# Patient Record
Sex: Female | Born: 1960 | Hispanic: Yes | Marital: Single | Smoking: Never smoker
Health system: Southern US, Community
[De-identification: ages and names within clinical notes are randomized; demographics above are authoritative.]

---

## 2009-04-22 ENCOUNTER — Ambulatory Visit: Payer: Self-pay | Admitting: Family Medicine

## 2009-05-01 ENCOUNTER — Ambulatory Visit: Payer: Self-pay | Admitting: Internal Medicine

## 2010-03-19 ENCOUNTER — Emergency Department: Payer: Self-pay | Admitting: Unknown Physician Specialty

## 2010-05-07 ENCOUNTER — Emergency Department: Payer: Self-pay | Admitting: Emergency Medicine

## 2011-11-21 IMAGING — CR DG CHEST 2V
1 series · 2 of 2 positions shown · non-contrast
Comparison: none

REASON FOR EXAM: cough
COMMENTS:

PROCEDURE:     DXR - DXR CHEST PA (OR AP) AND LATERAL  - May 07, 2010  [DATE]
RESULT:     The lungs are clear. The cardiac silhouette and visualized bony
skeleton are unremarkable.

[Series 1: view not recorded · 0.17mm/px · 2 of 2 slices shown]
[im 1/2]
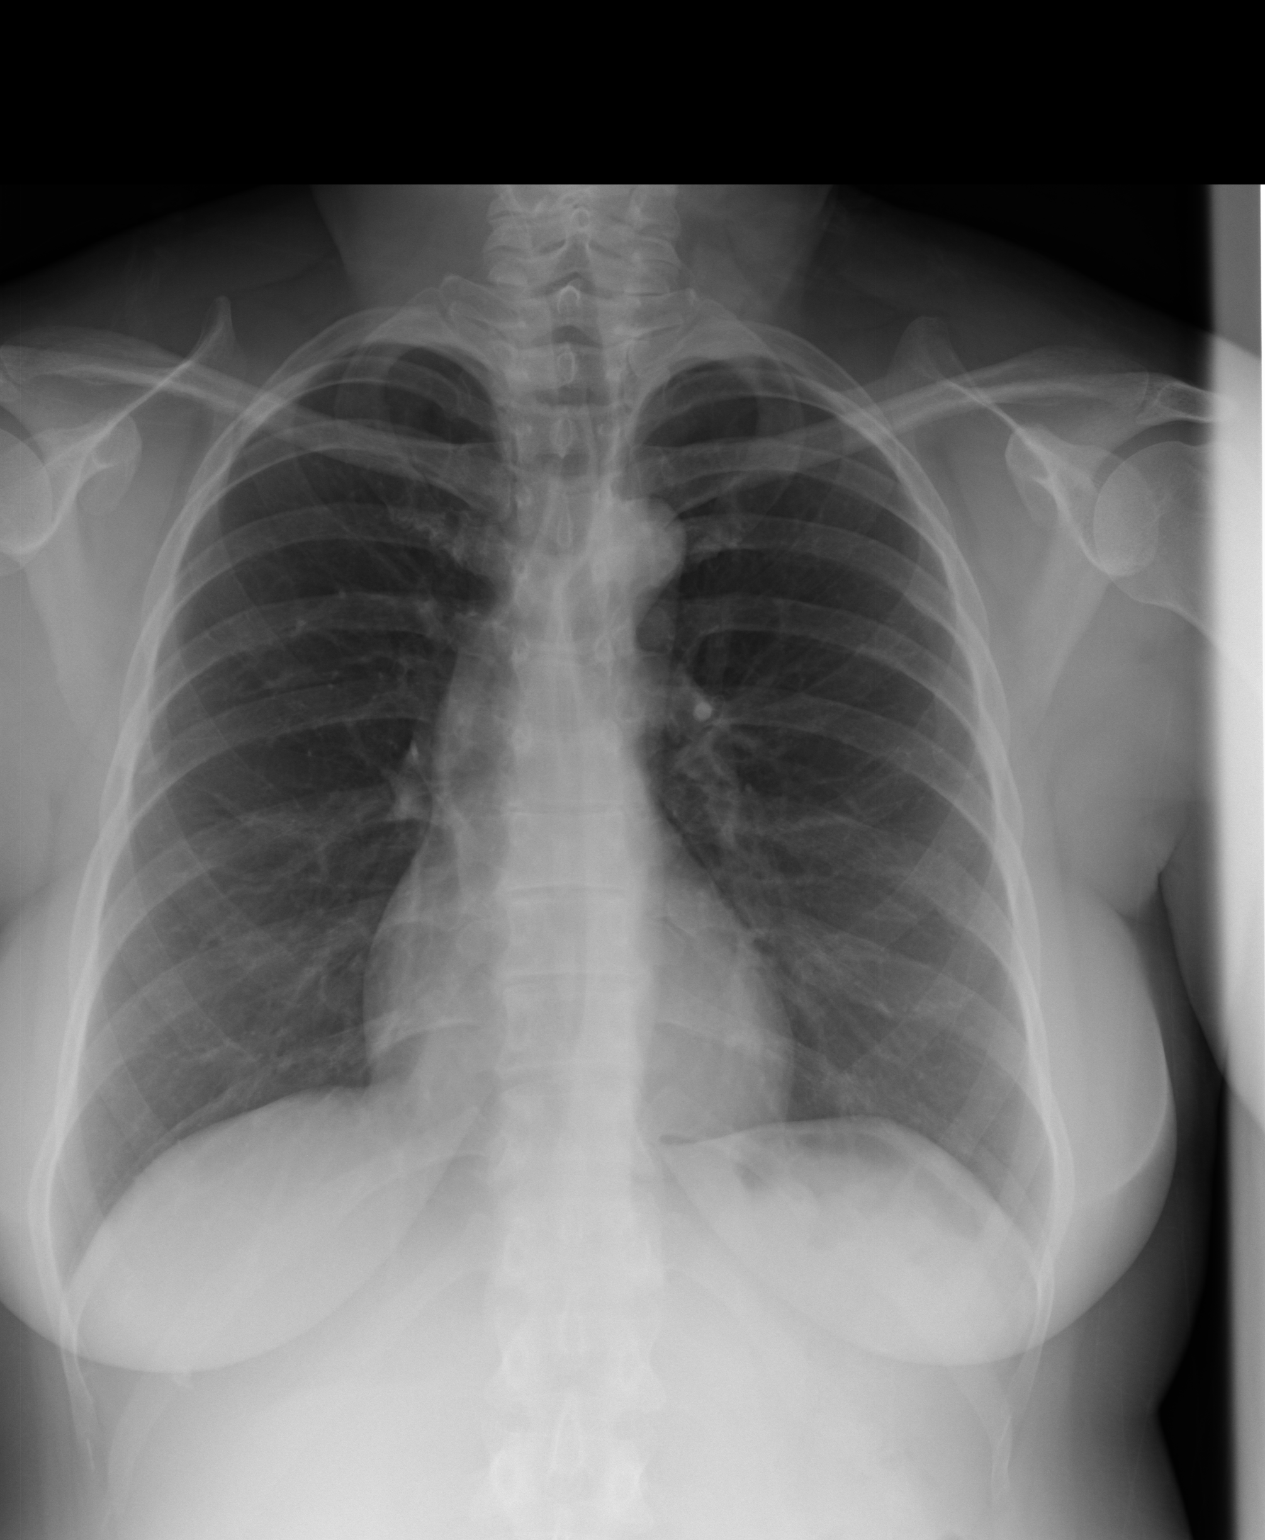
[im 2/2]
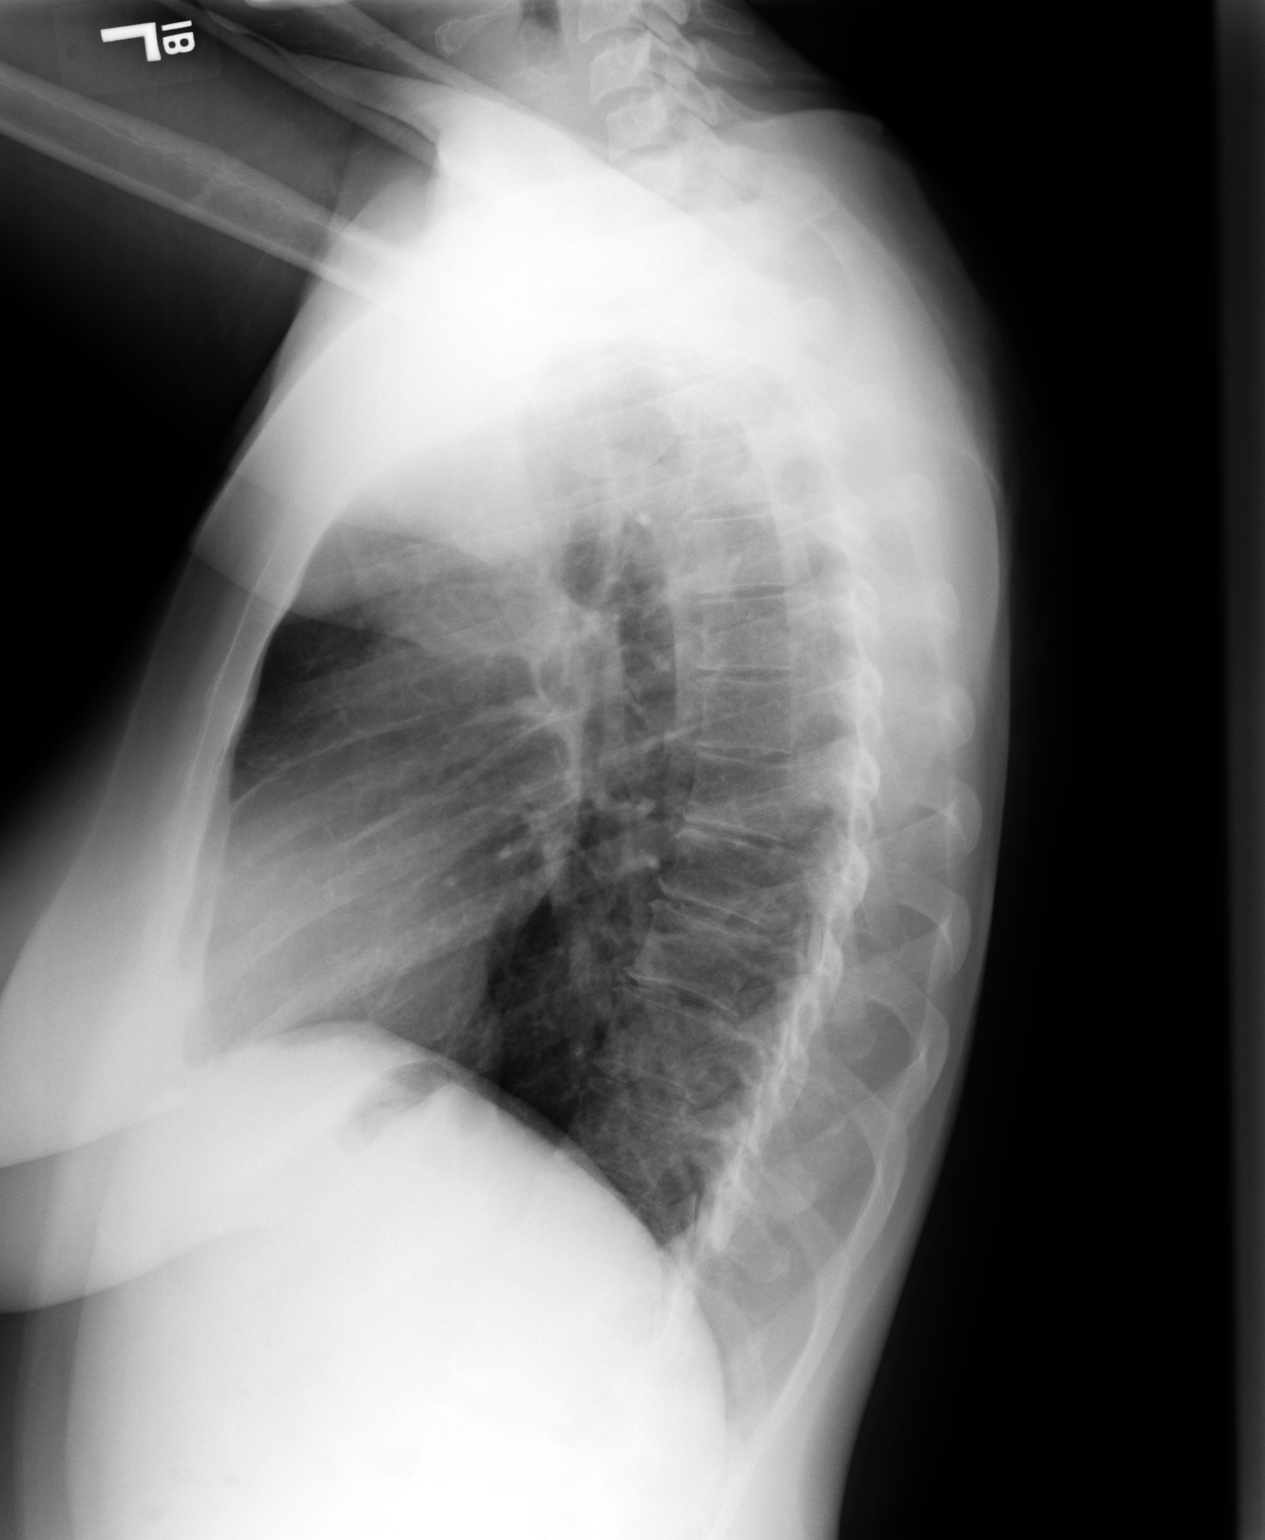

[2 of 2 positions shown; findings below may reference images not displayed]

IMPRESSION: 1. Chest radiograph without evidence of acute cardiopulmonary disease.

## 2015-04-08 ENCOUNTER — Ambulatory Visit: Payer: Self-pay

## 2015-04-17 ENCOUNTER — Ambulatory Visit: Payer: Self-pay | Attending: Oncology | Admitting: *Deleted

## 2015-04-17 ENCOUNTER — Encounter: Payer: Self-pay | Admitting: *Deleted

## 2015-04-17 ENCOUNTER — Ambulatory Visit
Admission: RE | Admit: 2015-04-17 | Discharge: 2015-04-17 | Disposition: A | Discharge status: Home / Self Care | Payer: Self-pay | Source: Ambulatory Visit | Attending: Oncology | Admitting: Oncology

## 2015-04-17 VITALS — BP 117/80 | HR 75 | Temp 97.3°F | Ht 62.21 in | Wt 154.8 lb

## 2015-04-17 DIAGNOSIS — Z0000 Encounter for general adult medical examination without abnormal findings: Secondary | ICD-10-CM

## 2015-04-17 NOTE — Progress Notes (Signed)
Subjective:     Patient ID: Tiffany Figueroa, female   DOB: [birth year], 54 y.o.   MRN: [id number]  HPI   Review of Systems     Objective:   Physical Exam  Pulmonary/Chest: Right breast exhibits no inverted nipple, no mass, no nipple discharge, no skin change and no tenderness. Left breast exhibits no inverted nipple, no mass, no nipple discharge, no skin change and no tenderness. Breasts are symmetrical.       Assessment:     54 year old Hispanic female referred to [facility] by Jeralyn Ruths for clinical breast exam and mammogram.  Debarah Crape, the interpreter present during the interview and exam.  Clinical breast exam unremarkable.  Taught self breast awareness. Patient has been screened for eligibility.  She does not have any insurance, Medicare or Medicaid.  She also meets financial eligibility.  Hand-out given on the Affordable Care Act.    Plan:     Screening mammogram ordered.  To follow-up per protocol.

## 2015-04-18 ENCOUNTER — Encounter: Payer: Self-pay | Admitting: *Deleted

## 2015-04-18 NOTE — Progress Notes (Signed)
Letter mailed to inform patient of her normal mammogram.  She is to follow-up in one year with annual screening.  HSIS to Christy. 

## 2019-01-18 ENCOUNTER — Ambulatory Visit: Payer: Self-pay

## 2020-02-14 ENCOUNTER — Other Ambulatory Visit: Payer: Self-pay

## 2020-02-14 ENCOUNTER — Emergency Department
Admission: EM | Admit: 2020-02-14 | Discharge: 2020-02-14 | Disposition: A | Discharge status: Home / Self Care | Payer: Self-pay | Attending: Student | Admitting: Student

## 2020-02-14 ENCOUNTER — Encounter: Payer: Self-pay | Admitting: *Deleted

## 2020-02-14 ENCOUNTER — Emergency Department: Payer: Self-pay

## 2020-02-14 DIAGNOSIS — M1711 Unilateral primary osteoarthritis, right knee: Secondary | ICD-10-CM | POA: Insufficient documentation

## 2020-02-14 DIAGNOSIS — M21061 Valgus deformity, not elsewhere classified, right knee: Secondary | ICD-10-CM | POA: Insufficient documentation

## 2020-02-14 MED ORDER — MELOXICAM 15 MG PO TABS: 15.0000 mg | ORAL_TABLET | Freq: Every day | ORAL | 0 refills | Status: AC

## 2020-02-14 MED ORDER — HYDROCHLOROTHIAZIDE 12.5 MG PO CAPS: 12.5000 mg | ORAL_CAPSULE | Freq: Every day | ORAL | 6 refills | Status: AC

## 2020-02-14 MED ORDER — PREDNISONE 10 MG PO TABS: 10.0000 mg | ORAL_TABLET | Freq: Every day | ORAL | 0 refills | Status: AC

## 2020-02-14 NOTE — ED Provider Notes (Signed)
[facility] Emergency Department Provider Note  ____________________________________________  Time seen: Approximately 7:41 PM  I have reviewed the triage vital signs and the nursing notes.   HISTORY  Chief Complaint Leg Pain    HPI Tiffany Figueroa is a 59 y.o. female who presents the emergency department with her son for complaint of right knee pain.  Patient reportedly had an injury several years ago, has been having problems since.  There was no reported fracture or ligament injury from the initial injury.  Patient is complaining of pain to the lateral aspect of the knee.  No recent trauma.  No edema or erythema.  Patient's son is also stating that the patient needs her "fluid pill" for her blood pressure.  Patient has had some intermittent lower extremity edema.  Evidently patient has taken probably HCTZ plus lisinopril but has not had the HCTZ recently.  For this time the patient will have some peripheral edema but this resolved with either movement or on its own.  They are requesting medication refill for same.         No past medical history on file.  There are no problems to display for this patient.   No past surgical history on file.  Prior to Admission medications   Medication Sig Start Date End Date Taking? Authorizing Provider  hydrochlorothiazide (MICROZIDE) 12.5 MG capsule Take 1 capsule (12.5 mg total) by mouth daily. 02/14/20 02/13/21  Tishawn Friedhoff, Delorise Royals, PA-C  meloxicam (MOBIC) 15 MG tablet Take 1 tablet (15 mg total) by mouth daily. 02/14/20   Taquila Leys, Delorise Royals, PA-C  predniSONE (DELTASONE) 10 MG tablet Take 1 tablet (10 mg total) by mouth daily. 02/14/20   Hiram Mciver, Delorise Royals, PA-C    Allergies Patient has no known allergies.  No family history on file.  Social History Social History   Tobacco Use  . Smoking status: Never Smoker  . Smokeless tobacco: Never Used  Substance Use Topics  . Alcohol use: Not Currently  .  Drug use: Not Currently     Review of Systems  Constitutional: No fever/chills Eyes: No visual changes. No discharge ENT: No upper respiratory complaints. Cardiovascular: no chest pain. Respiratory: no cough. No SOB. Gastrointestinal: No abdominal pain.  No nausea, no vomiting.  No diarrhea.  No constipation. Musculoskeletal: Positive for right knee pain Skin: Negative for rash, abrasions, lacerations, ecchymosis. Neurological: Negative for headaches, focal weakness or numbness. 10-point ROS otherwise negative.  ____________________________________________   PHYSICAL EXAM:  VITAL SIGNS: ED Triage Vitals  Enc Vitals Group     BP 02/14/20 1712 (!) 153/97     Pulse Rate 02/14/20 1712 68     Resp 02/14/20 1712 18     Temp 02/14/20 1712 98 F (36.7 C)     Temp Source 02/14/20 1712 Oral     SpO2 --      Weight 02/14/20 1713 155 lb (70.3 kg)     Height 02/14/20 1713 5\' 2"  (1.575 m)     Head Circumference --      Peak Flow --      Pain Score 02/14/20 1713 5     Pain Loc --      Pain Edu? --      Excl. in GC? --      Constitutional: Alert and oriented. Well appearing and in no acute distress. Eyes: Conjunctivae are normal. PERRL. EOMI. Head: Atraumatic. ENT:      Ears:       Nose: No congestion/rhinnorhea.  Mouth/Throat: Mucous membranes are moist.  Neck: No stridor.    Cardiovascular: Normal rate, regular rhythm. Normal S1 and S2.  Good peripheral circulation. Respiratory: Normal respiratory effort without tachypnea or retractions. Lungs CTAB. Good air entry to the bases with no decreased or absent breath sounds. Musculoskeletal: Full range of motion to all extremities. No gross deformities appreciated.  Visualization of the right lower extremity reveals obvious valgus knee.  No obvious deformity.  No erythema, gross edema of the knee when compared with unaffected knee.  Patient is tender palpation along the lateral joint line.  No tenderness to palpation.  No palpable  abnormalities.  Examination of the hip and ankle is unremarkable. Neurologic:  Normal speech and language. No gross focal neurologic deficits are appreciated.  Skin:  Skin is warm, dry and intact. No rash noted. Psychiatric: Mood and affect are normal. Speech and behavior are normal. Patient exhibits appropriate insight and judgement.   ____________________________________________   LABS (all labs ordered are listed, but only abnormal results are displayed)  Labs Reviewed - No data to display ____________________________________________  EKG   ____________________________________________  RADIOLOGY I personally viewed and evaluated these images as part of my medical decision making, as well as reviewing the written report by the radiologist.  DG Knee Complete 4 Views Right  Result Date: 02/14/2020 CLINICAL DATA:  Lateral knee pain, history of remote injury several years ago, initial encounter EXAM: RIGHT KNEE - COMPLETE 4+ VIEW COMPARISON:  None. FINDINGS: Degenerative changes are noted most prominent in the lateral joint space as well as the patellofemoral joint space. No joint effusion is seen. No acute fracture or dislocation is noted. IMPRESSION: Degenerative change without acute abnormality. Electronically Signed   By: Alcide Clever M.D.   On: 02/14/2020 19:16    ____________________________________________    PROCEDURES  Procedure(s) performed:    Procedures    Medications - No data to display   ____________________________________________   INITIAL IMPRESSION / ASSESSMENT AND PLAN / ED COURSE  Pertinent labs & imaging results that were available during my care of the patient were reviewed by me and considered in my medical decision making (see chart for details).  Review of the [place] CSRS was performed in accordance of the [facility] prior to dispensing any controlled drugs.           Patient's diagnosis is consistent with osteoarthritis of the knee.  Patient  presents the emergency department complaining of right knee pain.  Patient had an obvious valgus knee which is chronic.  On exam patient was tender along the lateral joint line.  Imaging reveals significant degenerative changes.  Patient was also requesting a refill of her "fluid pill" for her high blood pressure.  No other complaints at this time.  Patient will be placed on anti-inflammatory for knee, will refill her HCTZ.  As dosing is unsure, I will start at 12.5.  Follow-up with orthopedics and primary care.  Patient is given ED precautions to return to the ED for any worsening or new symptoms.     ____________________________________________  FINAL CLINICAL IMPRESSION(S) / ED DIAGNOSES  Final diagnoses:  Primary osteoarthritis of right knee  Acquired genu valgum of right knee      NEW MEDICATIONS STARTED DURING THIS VISIT:  ED Discharge Orders         Ordered    meloxicam (MOBIC) 15 MG tablet  Daily     02/14/20 1954    predniSONE (DELTASONE) 10 MG tablet  Daily    Note  to Pharmacy: Take 6 pills x 2 days, 5 pills x 2 days, 4 pills x 2 days, 3 pills x 2 days, 2 pills x 2 days, and 1 pill x 2 days   02/14/20 1954    hydrochlorothiazide (MICROZIDE) 12.5 MG capsule  Daily     02/14/20 1954              This chart was dictated using voice recognition software/Dragon. Despite best efforts to proofread, errors can occur which can change the meaning. Any change was purely unintentional.    Darletta Moll, PA-C 02/14/20 1955    Lilia Pro., MD 02/15/20 [phone]

## 2020-02-14 NOTE — ED Triage Notes (Addendum)
Pt ambulatory to triage.  Pt has pain in right lower leg.  Pt injured right leg at work years ago.  Pt continues to have pain. no recent injury to leg.   No swelling noted   Pt alert  Speech clear.  Interpreter on stick used in triage.

## 2020-02-14 NOTE — Discharge Instructions (Signed)
Patient with osteoarthritis of the knee.  Significant degenerative changes along the lateral compartment.  Patient will likely need knee replacement.  Follow-up with orthopedics.

## 2020-02-14 NOTE — ED Notes (Signed)
See triage note  Presents with pain to right leg  No injury or swelling  Ambulates well

## 2022-12-10 ENCOUNTER — Other Ambulatory Visit: Payer: Self-pay | Admitting: Primary Care

## 2022-12-10 DIAGNOSIS — Z1231 Encounter for screening mammogram for malignant neoplasm of breast: Secondary | ICD-10-CM

## 2024-08-23 ENCOUNTER — Other Ambulatory Visit: Payer: Self-pay | Admitting: Primary Care

## 2024-08-23 DIAGNOSIS — Z1231 Encounter for screening mammogram for malignant neoplasm of breast: Secondary | ICD-10-CM
